# Patient Record
Sex: Female | Born: 2000 | Race: White | Hispanic: No | Marital: Single | Smoking: Never smoker
Health system: Southern US, Community
[De-identification: ages and names within clinical notes are randomized; demographics above are authoritative.]

---
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
## 2023-04-21 DIAGNOSIS — Z62819 Personal history of unspecified abuse in childhood: Secondary | ICD-10-CM | POA: Insufficient documentation

## 2023-04-21 DIAGNOSIS — F4310 Post-traumatic stress disorder, unspecified: Secondary | ICD-10-CM | POA: Insufficient documentation

## 2024-01-06 ENCOUNTER — Telehealth: Admitting: Physician Assistant

## 2024-01-06 DIAGNOSIS — J3489 Other specified disorders of nose and nasal sinuses: Secondary | ICD-10-CM | POA: Diagnosis not present

## 2024-01-06 MED ORDER — MUPIROCIN 2 % EX OINT
1.0000 | TOPICAL_OINTMENT | Freq: Two times a day (BID) | CUTANEOUS | 0 refills | Status: AC
Start: 2024-01-06 — End: 2024-01-13

## 2024-01-06 NOTE — Progress Notes (Signed)
 Virtual Visit Consent   [name], you are scheduled for a virtual visit with a [facility] provider today. Just as with appointments in the office, your consent must be obtained to participate. Your consent will be active for this visit and any virtual visit you may have with one of our providers in the next 365 days. If you have a MyChart account, a copy of this consent can be sent to you electronically.  As this is a virtual visit, video technology does not allow for your provider to perform a traditional examination. This may limit your provider's ability to fully assess your condition. If your provider identifies any concerns that need to be evaluated in person or the need to arrange testing (such as labs, EKG, etc.), we will make arrangements to do so. Although advances in technology are sophisticated, we cannot ensure that it will always work on either your end or our end. If the connection with a video visit is poor, the visit may have to be switched to a telephone visit. With either a video or telephone visit, we are not always able to ensure that we have a secure connection.  By engaging in this virtual visit, you consent to the provision of healthcare and authorize for your insurance to be billed (if applicable) for the services provided during this visit. Depending on your insurance coverage, you may receive a charge related to this service.  I need to obtain your verbal consent now. Are you willing to proceed with your visit today? TANAISHA PITTMAN has provided verbal consent on 01/06/2024 for a virtual visit (video or telephone). Alexandria Crane, [place]  Date: 01/06/2024 6:33 PM   Virtual Visit via Video Note   I, Alexandria Crane, connected with  MCKYNZI CAMMON  ([id number], 07-29-2000) on 01/06/24 at  6:15 PM EST by a video-enabled telemedicine application and verified that I am speaking with the correct person using two identifiers.  Location: Patient: Virtual Visit  Location Patient: Home Provider: Virtual Visit Location Provider: Home Office   I discussed the limitations of evaluation and management by telemedicine and the availability of in person appointments. The patient expressed understanding and agreed to proceed.    History of Present Illness: Alexandria Crane is a 23 y.o. who identifies as a female who was assigned female at birth, and is being seen today for irritation, crusting and some mild sores just inside her nares first noted over the past couple of months.  Notes she has tried to clean the area but it quickly returns.  Some occasional nosebleeds occurring as well.  Has tried to apply Aquaphor tallow to the area to keep it moisturized and protected.  Notes this is help prevent nosebleeds, but the crusting is still present.  Denies fevers or chills.  Denies rash of skin  HPI: HPI  Problems:  Patient Active Problem List   Diagnosis Date Noted   PTSD (post-traumatic stress disorder) 04/21/2023   Trauma in childhood 04/21/2023   Attention deficit disorder (ADD) without hyperactivity 11/27/2017    Allergies:  Allergies  Allergen Reactions   Bee Venom    Medications:  Current Outpatient Medications:    clonazePAM (KLONOPIN) 0.5 MG tablet, Take 0.5 mg by mouth., Disp: , Rfl:    desvenlafaxine (PRISTIQ) 100 MG 24 hr tablet, Take 100 mg by mouth., Disp: , Rfl:    escitalopram (LEXAPRO) 20 MG tablet, Take 30 mg by mouth daily., Disp: , Rfl:    mupirocin  ointment (  BACTROBAN ) 2 %, Place 1 Application into the nose 2 (two) times daily for 7 days., Disp: 22 g, Rfl: 0   [START ON 02/25/2024] lisdexamfetamine (VYVANSE) 30 MG capsule, Take 30 mg by mouth., Disp: , Rfl:   Observations/Objective: Patient is well-developed, well-nourished in no acute distress.  Resting comfortably  at home.  Head is normocephalic, atraumatic.  No labored breathing.  Speech is clear and coherent with logical content.  Patient is alert and oriented at baseline.    Assessment and Plan: 1. Nasal vestibulitis (Primary) - mupirocin  ointment (BACTROBAN ) 2 %; Place 1 Application into the nose 2 (two) times daily for 7 days.  Dispense: 22 g; Refill: 0  Mild nasal vestibulitis.  At this point suspect potentially mild staph or strep infection.  Supportive measures and OTC medications reviewed.  Start mupirocin  twice daily for 7 days.  Recommend humidifier placed in bedroom at night.  Saline nasal gel to help keep nasal passages moisturized and prevent nosebleeds.  In person follow-up discussed.  Follow Up Instructions: I discussed the assessment and treatment plan with the patient. The patient was provided an opportunity to ask questions and all were answered. The patient agreed with the plan and demonstrated an understanding of the instructions.  A copy of instructions were sent to the patient via MyChart unless otherwise noted below.   The patient was advised to call back or seek an in-person evaluation if the symptoms worsen or if the condition fails to improve as anticipated.    Alexandria Velma Lunger, PA-C

## 2024-01-06 NOTE — Patient Instructions (Signed)
  Alexandria Crane, thank you for joining Alexandria Velma Lunger, PA-C for today's virtual visit.  While this provider is not your primary care provider (PCP), if your PCP is located in our provider database this encounter information will be shared with them immediately following your visit.   A [facility] MyChart account gives you access to today's visit and all your visits, tests, and labs performed at [facility]  click here if you don't have a [facility] MyChart account or go to [email or web address]  Consent: (Patient) Alexandria Crane provided verbal consent for this virtual visit at the beginning of the encounter.  Current Medications:  Current Outpatient Medications:    ADDERALL XR 10 MG 24 hr capsule, TAKE 1 CAPSULE BY MOUTH DAILY., Disp: 30 capsule, Rfl: 0   Medications ordered in this encounter:  No orders of the defined types were placed in this encounter.    *If you need refills on other medications prior to your next appointment, please contact your pharmacy*  Follow-Up: Call back or seek an in-person evaluation if the symptoms worsen or if the condition fails to improve as anticipated.  [facility] [phone]  Other Instructions Please avoid picking at the area. Keep hands clean and dry. Run a humidifier in the bedroom at night. Use over-the-counter saline nasal gel as discussed. Use the prescribed antibiotic as directed. If you note any non-resolving, new, or worsening symptoms despite treatment, please seek an in-person evaluation ASAP.    If you have been instructed to have an in-person evaluation today at a local Urgent Care facility, please use the link below. It will take you to a list of all of our available [facility], including address, phone number and hours of operation. Please do not delay care.  [facility]  If you or a family member do not have a primary care provider, use the link below to  schedule a visit and establish care. When you choose a [facility] primary care physician or advanced practice provider, you gain a long-term partner in health. Find a Primary Care Provider  Learn more about [facility]'s in-office and virtual care options: [facility] - Get Care Now
